# Patient Record
Sex: Female | Born: 2000 | Race: Black or African American | Hispanic: No | Marital: Single
Health system: Southern US, Community
[De-identification: ages and names within clinical notes are randomized; demographics above are authoritative.]

## PROBLEM LIST (undated history)

## (undated) HISTORY — PX: WISDOM TOOTH EXTRACTION: SHX21

---
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
